# Patient Record
Sex: Female | Born: 1975 | Race: White | Hispanic: No | Marital: Married | Smoking: Never smoker
Health system: Southern US, Community
[De-identification: ages and names within clinical notes are randomized; demographics above are authoritative.]

## PROBLEM LIST (undated history)

## (undated) HISTORY — PX: BUNIONECTOMY: SHX129

---

## 2016-03-25 ENCOUNTER — Ambulatory Visit (INDEPENDENT_AMBULATORY_CARE_PROVIDER_SITE_OTHER): Payer: BC Managed Care – PPO | Admitting: Internal Medicine

## 2016-03-25 VITALS — BP 110/72 | HR 76 | Temp 98.2°F | Wt 142.6 lb

## 2016-03-25 DIAGNOSIS — J329 Chronic sinusitis, unspecified: Secondary | ICD-10-CM | POA: Insufficient documentation

## 2016-03-25 DIAGNOSIS — J3489 Other specified disorders of nose and nasal sinuses: Secondary | ICD-10-CM

## 2016-03-25 DIAGNOSIS — Z825 Family history of asthma and other chronic lower respiratory diseases: Secondary | ICD-10-CM

## 2016-03-25 DIAGNOSIS — Z975 Presence of (intrauterine) contraceptive device: Secondary | ICD-10-CM | POA: Diagnosis not present

## 2016-03-25 DIAGNOSIS — Z7689 Persons encountering health services in other specified circumstances: Secondary | ICD-10-CM | POA: Diagnosis not present

## 2016-03-25 DIAGNOSIS — R05 Cough: Secondary | ICD-10-CM

## 2016-03-25 DIAGNOSIS — Z0000 Encounter for general adult medical examination without abnormal findings: Secondary | ICD-10-CM | POA: Insufficient documentation

## 2016-03-25 DIAGNOSIS — J0100 Acute maxillary sinusitis, unspecified: Secondary | ICD-10-CM

## 2016-03-25 MED ORDER — CETIRIZINE HCL 10 MG PO TABS: 10.0000 mg | ORAL_TABLET | Freq: Every day | ORAL | 0 refills | Status: AC

## 2016-03-25 MED ORDER — AMOXICILLIN-POT CLAVULANATE 875-125 MG PO TABS: 1.0000 | ORAL_TABLET | Freq: Two times a day (BID) | ORAL | 0 refills | Status: AC

## 2016-03-25 NOTE — Progress Notes (Signed)
   CC: Cough and congestion  HPI:  [name] is a 41 y.o. woman here today to establish medical care in [place] and with a month long history of congestion and coughing.  See problem based assessment and plan below for additional details  Medical History: She has no personal history of asthma or seasonal allergies. Two previous vaginal deliveries without complication children now aged 607 and 2  Surgical History: Past bunionectomy  Family History: Mother - alive, asthma Father - deceased from MVC, no medical Hx known  Social History: Never smoker Approximately 3 alcoholic drinks per week No other drug use history IUD for contraception, sexually active with one female partner for 17 years  Review of Systems:  Review of Systems  Constitutional: Negative for fever.  HENT: Positive for tinnitus. Negative for hearing loss.   Eyes: Negative for blurred vision.  Respiratory: Positive for cough. Negative for shortness of breath.   Cardiovascular: Negative for chest pain and leg swelling.  Gastrointestinal: Negative for abdominal pain.  Genitourinary: Negative for dysuria.  Musculoskeletal: Negative for myalgias.  Skin: Negative for rash.  Neurological: Negative for sensory change.  Endo/Heme/Allergies: Negative for environmental allergies.  Psychiatric/Behavioral: Negative for depression and substance abuse.   Physical Exam: Physical Exam  Constitutional: She is well-developed, well-nourished, and in no distress. No distress.  HENT:  Head: Normocephalic and atraumatic.  Mouth/Throat: Oropharynx is clear and moist. No oropharyngeal exudate.  TMs clear bilaterally, small scars present  Eyes: Conjunctivae are normal.  Neck: Normal range of motion. No thyromegaly present.  Cardiovascular: Normal rate and regular rhythm.   No murmur heard. Pulmonary/Chest: Effort normal and breath sounds normal. She has no wheezes.  Abdominal: Soft. There is no tenderness.    Musculoskeletal: She exhibits no edema.  Lymphadenopathy:    She has no cervical adenopathy.  Skin: Skin is warm and dry. No rash noted.  Psychiatric: Mood and affect normal.    Vitals:   03/25/16 0829  BP: 110/72  Pulse: 76  Temp: 98.2 F (36.8 C)  TempSrc: Oral  Weight: 142 lb 9.6 oz (64.7 kg)    Assessment & Plan:   See Encounters Tab for problem based charting.  Patient discussed with Dr. Heide SparkNarendra

## 2016-03-25 NOTE — Progress Notes (Signed)
Internal Medicine Clinic Attending  Case discussed with Dr. Rice at the time of the visit.  We reviewed the resident's history and exam and pertinent patient test results.  I agree with the assessment, diagnosis, and plan of care documented in the resident's note.  

## 2016-03-25 NOTE — Patient Instructions (Signed)
It was a pleasure to see you today Ms. Carolyn Anderson.  For your cough I suspect a lot of this is due to drainage from your sinuses due to an infectious process. This may be a viral problem that will simply require time to improve or it may have some secondary bacterial infection that would benefit from treatment. I have sent a prescription for Augmentin twice daily for 5 days. In the meantime I recommend using over the counter antihistamine such as Zyrtec or Claritin 10mg  once daily for your symptoms.

## 2016-03-25 NOTE — Assessment & Plan Note (Signed)
HPI: Carolyn Anderson is an otherwise health 41 year old woman here with a month long symptoms of sinus congestion and pressure with a persistent cough for one month. She does not commonly experience similar symptoms and denies chronic allergic or seasonal rhinitis. She has tried taking over the counter dayquil, nyquil, and afrin with minimal improvement in her symptoms. She has intermittent tinnitus but denies any hearing loss or balance problems. She denies fevers, dyspnea, body aches, rashes, or diarrhea.  A: Persistent upper airway congestion causing an upper airway cough syndrome with no prior history of chronic allergies or rhinitis. These would be unlikely though not impossible to start acutely at this time. A viral URI should most often have improvement within a month of constant symptoms. Her symptoms are not severe, but they are prolonged enough that a short course of oral antibiotics is appropriate.  P: -Augmentin PO BID x5 days -Start second generation antihistamine cetirizine or loratadine 10mg  daily while symptomatic -Instructed her to call back in a week or two if there is no improvement in symptoms

## 2016-03-25 NOTE — Assessment & Plan Note (Signed)
She was previously followed by a provider in [place] but cannot recall the practice name at this time. She mostly received care through her OBGYN office as she has no chronic medical problems.   Ideally we can get records if she brings their information to limit duplication of testing.

## 2016-08-07 ENCOUNTER — Other Ambulatory Visit: Payer: Self-pay | Admitting: Family Medicine

## 2016-08-07 ENCOUNTER — Other Ambulatory Visit (HOSPITAL_COMMUNITY)
Admission: RE | Admit: 2016-08-07 | Discharge: 2016-08-07 | Disposition: A | Discharge status: Home / Self Care | Payer: BC Managed Care – PPO | Source: Ambulatory Visit | Attending: Family Medicine | Admitting: Family Medicine

## 2016-08-07 DIAGNOSIS — Z01419 Encounter for gynecological examination (general) (routine) without abnormal findings: Secondary | ICD-10-CM | POA: Diagnosis not present

## 2016-08-11 LAB — CYTOLOGY - PAP: Diagnosis: NEGATIVE

## 2018-02-23 HISTORY — PX: BREAST BIOPSY: SHX20

## 2018-02-28 ENCOUNTER — Other Ambulatory Visit: Payer: Self-pay | Admitting: Family Medicine

## 2018-02-28 DIAGNOSIS — Z1231 Encounter for screening mammogram for malignant neoplasm of breast: Secondary | ICD-10-CM

## 2018-03-30 ENCOUNTER — Ambulatory Visit
Admission: RE | Admit: 2018-03-30 | Discharge: 2018-03-30 | Disposition: A | Discharge status: Home / Self Care | Payer: BC Managed Care – PPO | Source: Ambulatory Visit | Attending: Family Medicine | Admitting: Family Medicine

## 2018-03-30 ENCOUNTER — Encounter: Payer: Self-pay | Admitting: Radiology

## 2018-03-30 DIAGNOSIS — Z1231 Encounter for screening mammogram for malignant neoplasm of breast: Secondary | ICD-10-CM

## 2018-03-31 ENCOUNTER — Other Ambulatory Visit: Payer: Self-pay | Admitting: Family Medicine

## 2018-03-31 DIAGNOSIS — R928 Other abnormal and inconclusive findings on diagnostic imaging of breast: Secondary | ICD-10-CM

## 2018-04-12 ENCOUNTER — Ambulatory Visit
Admission: RE | Admit: 2018-04-12 | Discharge: 2018-04-12 | Disposition: A | Discharge status: Home / Self Care | Payer: BC Managed Care – PPO | Source: Ambulatory Visit | Attending: Family Medicine | Admitting: Family Medicine

## 2018-04-12 ENCOUNTER — Other Ambulatory Visit: Payer: Self-pay | Admitting: Family Medicine

## 2018-04-12 DIAGNOSIS — N6320 Unspecified lump in the left breast, unspecified quadrant: Secondary | ICD-10-CM

## 2018-04-12 DIAGNOSIS — R928 Other abnormal and inconclusive findings on diagnostic imaging of breast: Secondary | ICD-10-CM

## 2018-04-15 ENCOUNTER — Ambulatory Visit
Admission: RE | Admit: 2018-04-15 | Discharge: 2018-04-15 | Disposition: A | Discharge status: Home / Self Care | Payer: BC Managed Care – PPO | Source: Ambulatory Visit | Attending: Family Medicine | Admitting: Family Medicine

## 2018-04-15 DIAGNOSIS — N6320 Unspecified lump in the left breast, unspecified quadrant: Secondary | ICD-10-CM

## 2018-04-25 ENCOUNTER — Other Ambulatory Visit: Payer: BC Managed Care – PPO

## 2019-04-20 ENCOUNTER — Other Ambulatory Visit: Payer: BC Managed Care – PPO

## 2019-04-30 ENCOUNTER — Ambulatory Visit: Payer: BC Managed Care – PPO | Attending: Internal Medicine

## 2019-04-30 DIAGNOSIS — Z23 Encounter for immunization: Secondary | ICD-10-CM | POA: Insufficient documentation

## 2019-04-30 NOTE — Progress Notes (Signed)
   Covid-19 Vaccination Clinic  Name:  Carolyn Anderson    MRN: [id number] DOB: December 28, 1975  04/30/2019  [name] was observed post Covid-19 immunization for 15 minutes without incident. She was provided with Vaccine Information Sheet and instruction to access the V-Safe system.   [name] was instructed to call 911 with any severe reactions post vaccine: Marland Kitchen Difficulty breathing  . Swelling of face and throat  . A fast heartbeat  . A bad rash all over body  . Dizziness and weakness   Immunizations Administered    Name Date Dose VIS Date Route   Pfizer COVID-19 Vaccine 04/30/2019 12:22 PM 0.3 mL 02/03/2019 Intramuscular   Manufacturer: [employer], [employer]   Lot: [id number]   NDC: [address]-6

## 2019-05-23 ENCOUNTER — Other Ambulatory Visit: Payer: Self-pay | Admitting: Family Medicine

## 2019-05-23 DIAGNOSIS — Z1231 Encounter for screening mammogram for malignant neoplasm of breast: Secondary | ICD-10-CM

## 2019-05-30 ENCOUNTER — Ambulatory Visit: Payer: BC Managed Care – PPO | Attending: Internal Medicine

## 2019-05-30 DIAGNOSIS — Z23 Encounter for immunization: Secondary | ICD-10-CM

## 2019-05-30 NOTE — Progress Notes (Signed)
   Covid-19 Vaccination Clinic  Name:  [name]    MRN: [id number] DOB: 1975/09/08  05/30/2019  Carolyn Anderson was observed post Covid-19 immunization for 15 minutes without incident. She was provided with Vaccine Information Sheet and instruction to access the V-Safe system.   Carolyn Anderson was instructed to call 911 with any severe reactions post vaccine: Marland Kitchen Difficulty breathing  . Swelling of face and throat  . A fast heartbeat  . A bad rash all over body  . Dizziness and weakness   Immunizations Administered    Name Date Dose VIS Date Route   Pfizer COVID-19 Vaccine 05/30/2019 12:04 PM 0.3 mL 02/03/2019 Intramuscular   Manufacturer: [employer], [employer]   Lot: [id number]   NDC: [address]-2

## 2019-07-04 ENCOUNTER — Other Ambulatory Visit: Payer: Self-pay | Admitting: Obstetrics and Gynecology

## 2019-07-04 DIAGNOSIS — T8332XA Displacement of intrauterine contraceptive device, initial encounter: Secondary | ICD-10-CM

## 2019-07-11 ENCOUNTER — Other Ambulatory Visit: Payer: Self-pay

## 2019-07-11 ENCOUNTER — Ambulatory Visit
Admission: RE | Admit: 2019-07-11 | Discharge: 2019-07-11 | Disposition: A | Discharge status: Home / Self Care | Payer: BC Managed Care – PPO | Source: Ambulatory Visit | Attending: Family Medicine | Admitting: Family Medicine

## 2019-07-11 DIAGNOSIS — Z1231 Encounter for screening mammogram for malignant neoplasm of breast: Secondary | ICD-10-CM

## 2019-07-12 ENCOUNTER — Ambulatory Visit
Admission: RE | Admit: 2019-07-12 | Discharge: 2019-07-12 | Disposition: A | Discharge status: Home / Self Care | Payer: BC Managed Care – PPO | Source: Ambulatory Visit | Attending: Obstetrics and Gynecology | Admitting: Obstetrics and Gynecology

## 2019-07-12 DIAGNOSIS — T8332XA Displacement of intrauterine contraceptive device, initial encounter: Secondary | ICD-10-CM

## 2020-06-25 IMAGING — MG DIGITAL SCREENING BILAT W/ TOMO W/ CAD
6 of 10 series · 6 of 30 positions shown · non-contrast
Comparison: Previous exam(s).

CLINICAL DATA: Screening.

EXAM:
DIGITAL SCREENING BILATERAL MAMMOGRAM WITH TOMO AND CAD

[L CC synth-2D]
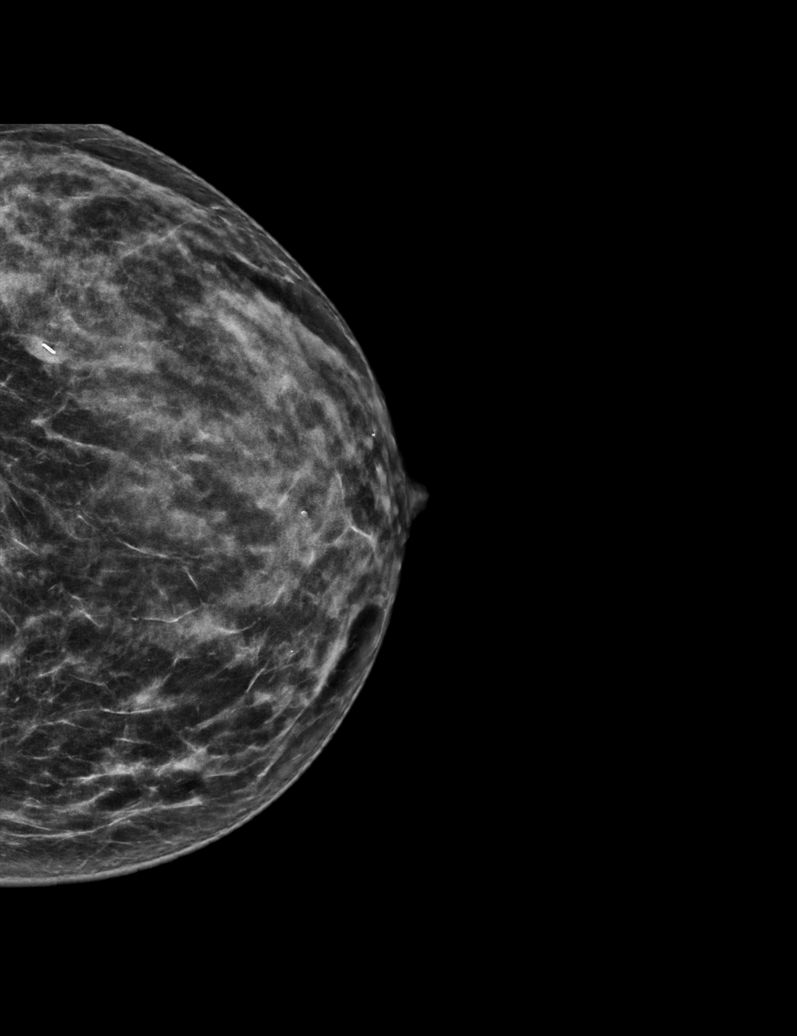

[R MLO synth-2D (1 of 2)]
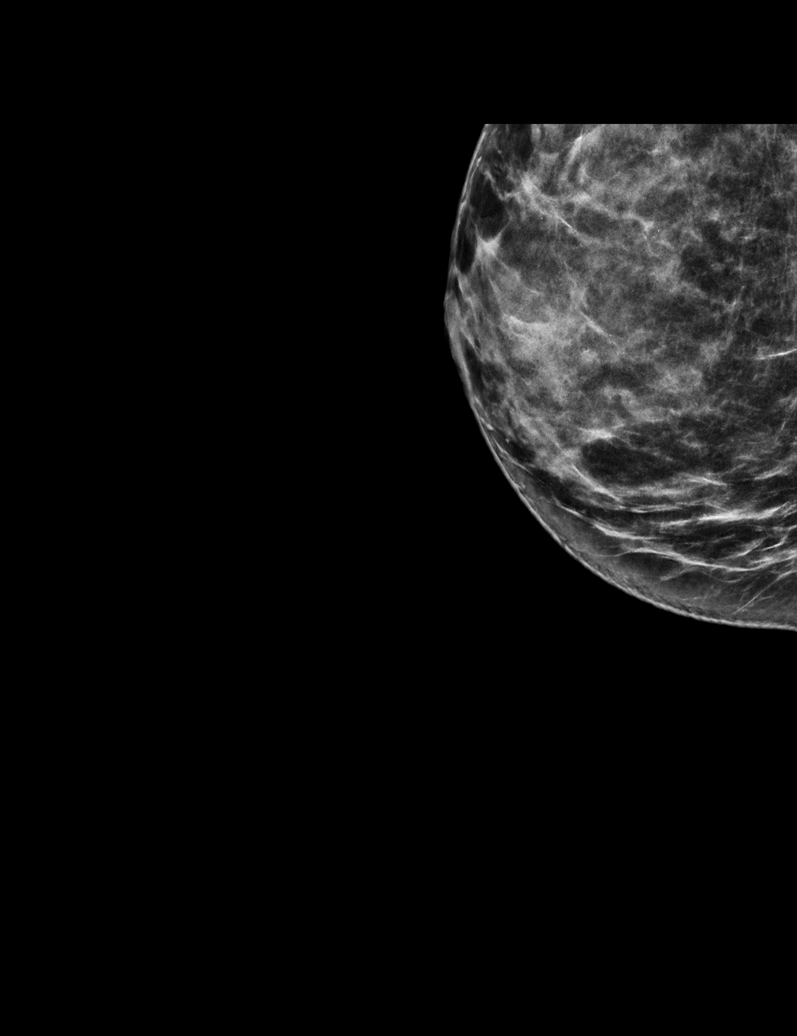

[R MLO synth-2D (2 of 2)]
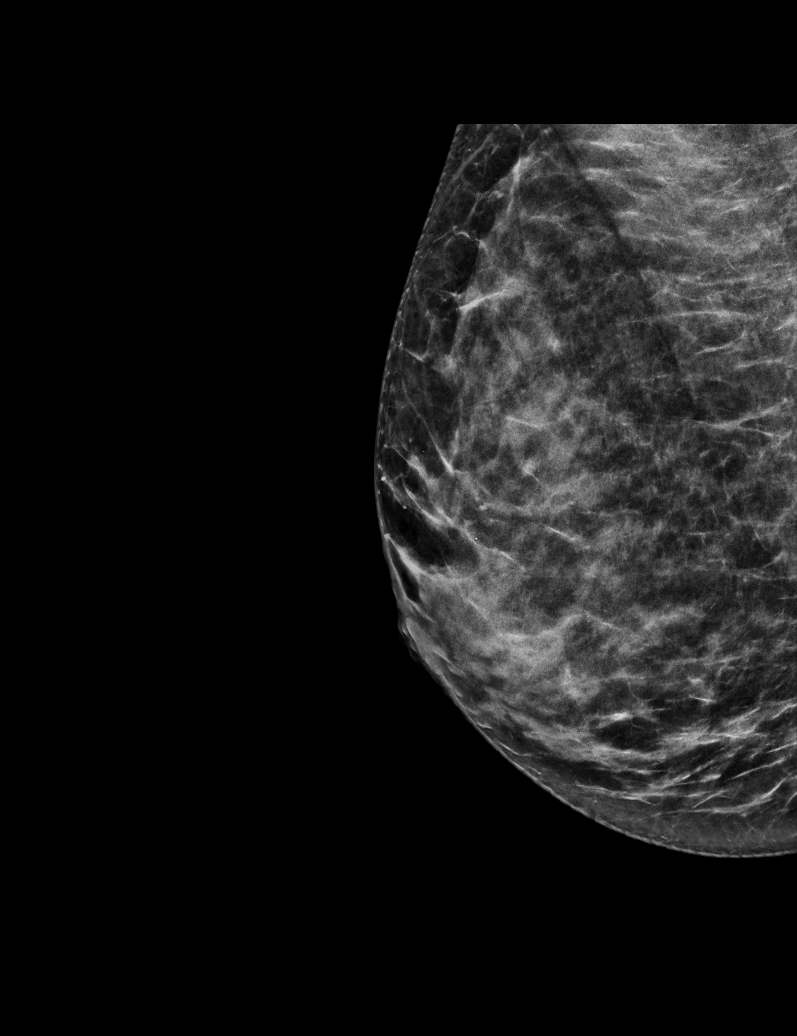

[L MLO synth-2D]
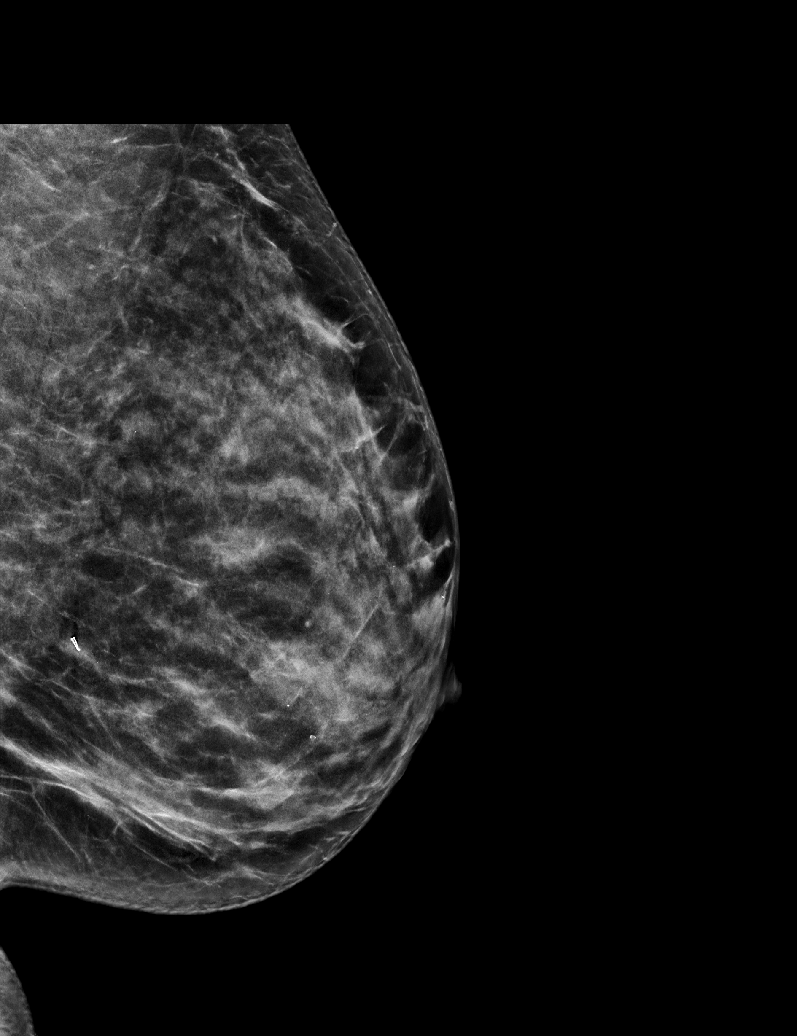

[R CC synth-2D]
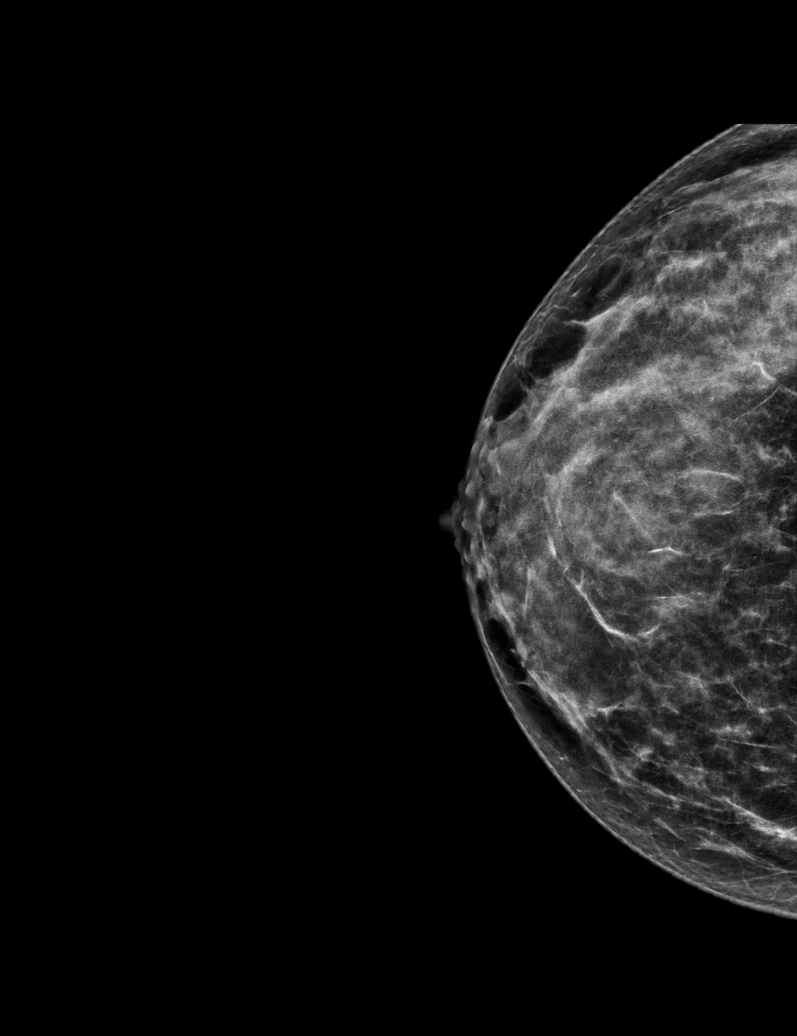

[R MLO tomo · tomo slice 29/56.0]
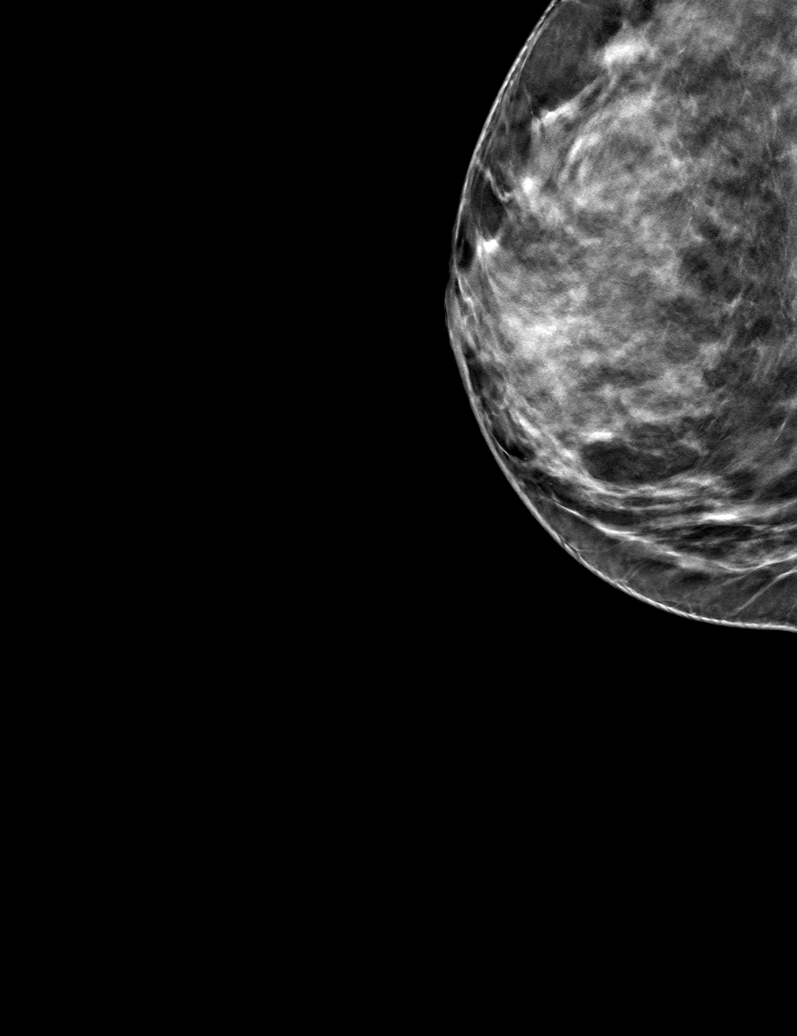

[6 of 30 positions shown; findings below may reference images not displayed]

ACR Breast Density Category c: The breast tissue is heterogeneously
dense, which may obscure small masses.
FINDINGS: There are no findings suspicious for malignancy. Images were
processed with CAD.
IMPRESSION: No mammographic evidence of malignancy. A result letter of this
screening mammogram will be mailed directly to the patient.

RECOMMENDATION:
Screening mammogram in one year. (Code:[id number])

BI-RADS CATEGORY  1: Negative.

## 2021-07-25 ENCOUNTER — Other Ambulatory Visit: Payer: Self-pay | Admitting: Family Medicine

## 2021-07-25 DIAGNOSIS — Z1231 Encounter for screening mammogram for malignant neoplasm of breast: Secondary | ICD-10-CM

## 2021-07-31 ENCOUNTER — Ambulatory Visit
Admission: RE | Admit: 2021-07-31 | Discharge: 2021-07-31 | Disposition: A | Discharge status: Home / Self Care | Payer: BC Managed Care – PPO | Source: Ambulatory Visit | Attending: Family Medicine | Admitting: Family Medicine

## 2021-07-31 DIAGNOSIS — Z1231 Encounter for screening mammogram for malignant neoplasm of breast: Secondary | ICD-10-CM

## 2022-07-07 ENCOUNTER — Other Ambulatory Visit: Payer: Self-pay | Admitting: Family Medicine

## 2022-07-07 DIAGNOSIS — Z1231 Encounter for screening mammogram for malignant neoplasm of breast: Secondary | ICD-10-CM

## 2022-08-25 ENCOUNTER — Ambulatory Visit
Admission: RE | Admit: 2022-08-25 | Discharge: 2022-08-25 | Disposition: A | Discharge status: Home / Self Care | Payer: BC Managed Care – PPO | Source: Ambulatory Visit

## 2022-08-25 DIAGNOSIS — Z1231 Encounter for screening mammogram for malignant neoplasm of breast: Secondary | ICD-10-CM

## 2024-02-09 ENCOUNTER — Other Ambulatory Visit: Payer: Self-pay | Admitting: Family Medicine

## 2024-02-09 DIAGNOSIS — Z1231 Encounter for screening mammogram for malignant neoplasm of breast: Secondary | ICD-10-CM

## 2024-03-03 ENCOUNTER — Ambulatory Visit
Admission: RE | Admit: 2024-03-03 | Discharge: 2024-03-03 | Disposition: A | Payer: Self-pay | Source: Ambulatory Visit | Attending: Family Medicine | Admitting: Family Medicine

## 2024-03-03 DIAGNOSIS — Z1231 Encounter for screening mammogram for malignant neoplasm of breast: Secondary | ICD-10-CM
